# Patient Record
Sex: Female | Born: 1985 | Race: White | Hispanic: No | Marital: Married | Smoking: Never smoker
Health system: Southern US, Community
[De-identification: ages and names within clinical notes are randomized; demographics above are authoritative.]

## PROBLEM LIST (undated history)

## (undated) DIAGNOSIS — E669 Obesity, unspecified: Secondary | ICD-10-CM

## (undated) DIAGNOSIS — K649 Unspecified hemorrhoids: Secondary | ICD-10-CM

## (undated) HISTORY — DX: Obesity, unspecified: E66.9

## (undated) HISTORY — PX: KNEE SURGERY: SHX244

## (undated) HISTORY — DX: Unspecified hemorrhoids: K64.9

## (undated) HISTORY — PX: ARTHROSCOPIC REPAIR ACL: SUR80

---

## 2013-11-26 ENCOUNTER — Ambulatory Visit (INDEPENDENT_AMBULATORY_CARE_PROVIDER_SITE_OTHER): Payer: BC Managed Care – PPO | Admitting: Obstetrics & Gynecology

## 2013-11-26 ENCOUNTER — Encounter: Payer: Self-pay | Admitting: Obstetrics & Gynecology

## 2013-11-26 VITALS — BP 140/90 | HR 91 | Resp 16 | Ht 65.0 in | Wt 228.0 lb

## 2013-11-26 DIAGNOSIS — Z30432 Encounter for removal of intrauterine contraceptive device: Secondary | ICD-10-CM | POA: Diagnosis not present

## 2013-11-26 DIAGNOSIS — Z124 Encounter for screening for malignant neoplasm of cervix: Secondary | ICD-10-CM

## 2013-11-26 DIAGNOSIS — Z01419 Encounter for gynecological examination (general) (routine) without abnormal findings: Secondary | ICD-10-CM

## 2013-11-26 NOTE — Addendum Note (Signed)
Addended by: Granville Lewis on: 11/26/2013 04:21 PM   Modules accepted: Orders

## 2013-11-26 NOTE — Progress Notes (Signed)
  Subjective:     Megan Knox is a 28 y.o. female here for a routine exam.  Current complaints: none.  Wants to become pregnant and wants IUD out.  History of 32 week delivery.  Personal health questionnaire reviewed: yes.   Gynecologic History Patient's last menstrual period was 11/18/2013. Contraception: IUD Last Pap: 2011. Results were: normal  Obstetric History OB History  Gravida Para Term Preterm AB SAB TAB Ectopic Multiple Living  # Outcome Date GA Lbr Len/2nd Weight Sex Delivery Anes PTL Lv  1 PRE  [redacted]w[redacted]d   M SVD          The following portions of the patient's history were reviewed and updated as appropriate: allergies, current medications, past family history, past medical history, past social history, past surgical history and problem list.  Review of Systems Pertinent items are noted in HPI.    Objective:      Filed Vitals:   11/26/13 1511  BP: 140/90  Pulse: 91  Resp: 16  Height:  (1.651 m)  Weight: 228 lb (103.42 kg)   Vitals:  WNL General appearance: alert, cooperative and no distress Head: Normocephalic, without obvious abnormality, atraumatic Eyes: negative Throat: lips, mucosa, and tongue normal; teeth and gums normal Lungs: clear to auscultation bilaterally Breasts: normal appearance, no masses or tenderness, No nipple retraction or dimpling, No nipple discharge or bleeding Heart: regular rate and rhythm Abdomen: soft, non-tender; bowel sounds normal; no masses,  no organomegaly  Pelvic:  External Genitalia:  Tanner V, no lesion Urethra:  No prolapse Vagina:  Pink, normal rugae, no blood or discharge Cervix:  No CMT, no lesion Uterus:  Normal size and contour, non tender Adnexa:  Normal, no masses, non tender  Extremities: no edema, redness or tenderness in the calves or thighs Skin: no lesions or rash Lymph nodes: Axillary adenopathy: none        Assessment:    Healthy female exam.    Plan:    Education reviewed:  self breast exams and skin cancer screening. Contraception: none. Follow up in: 1 year. PNV  GYNECOLOGY CLINIC PROCEDURE NOTE  Megan Knox is a 28 y.o. G1P0101 here for Mirena IUD removal and reinsertion. No GYN concerns.    IUD Removal  Patient identified, informed consent performed. Discussed risks of irregular bleeding, cramping, infection, malpositioning or misplacement of the IUD outside the uterus which may require further procedures. Time out was performed. Speculum placed in the vagina. Cervix visualized. Cleaned with Betadine x 2. Grasped anteriorly with a single tooth tenaculum. The strings of the IUD were grasped and pulled using ring forceps. The IUD was successfully removed in its entirety.

## 2013-12-01 LAB — CYTOLOGY - PAP

## 2014-01-26 ENCOUNTER — Encounter: Payer: Self-pay | Admitting: Obstetrics & Gynecology

## 2014-04-06 ENCOUNTER — Encounter: Payer: Self-pay | Admitting: Obstetrics & Gynecology

## 2014-04-06 ENCOUNTER — Ambulatory Visit (INDEPENDENT_AMBULATORY_CARE_PROVIDER_SITE_OTHER): Payer: 59 | Admitting: Obstetrics & Gynecology

## 2014-04-06 VITALS — BP 132/91 | HR 96 | Resp 16 | Ht 65.0 in | Wt 228.0 lb

## 2014-04-06 DIAGNOSIS — O4690 Antepartum hemorrhage, unspecified, unspecified trimester: Secondary | ICD-10-CM

## 2014-04-06 NOTE — Progress Notes (Signed)
   Subjective:    Patient ID: Leanora Ivanoffrin Dorsi, female    DOB: 11/13/1985, 29 y.o.   MRN: [id number]  HPI   MW 29 yo G2P1 at [redacted] weeks EGA. She was supposed to have a NOB visit today but she started having heavy bleeding last night and thinks that she is having a miscarriage.   Review of Systems     Objective:   Physical Exam  Thickened endometrium on u/s with normal ovaries and no free fluid in the pelvis. No CMT      Assessment & Plan:  Bleeding in early pregnancy- check QBHCG and type and screen Repeat QBHCG in 48 hours.

## 2014-04-07 ENCOUNTER — Telehealth: Payer: Self-pay | Admitting: *Deleted

## 2014-04-07 LAB — ABO AND RH: Rh Type: POSITIVE

## 2014-04-07 LAB — HCG, QUANTITATIVE, PREGNANCY

## 2014-04-07 NOTE — Telephone Encounter (Signed)
LM on voicemail of neg HCG and she does not need to repeat HCG tomorrow.

## 2014-04-26 ENCOUNTER — Encounter: Payer: Self-pay | Admitting: Advanced Practice Midwife

## 2014-05-31 ENCOUNTER — Encounter (HOSPITAL_COMMUNITY): Payer: Self-pay | Admitting: Emergency Medicine

## 2014-05-31 ENCOUNTER — Emergency Department (HOSPITAL_COMMUNITY)
Admission: EM | Admit: 2014-05-31 | Discharge: 2014-06-01 | Disposition: A | Discharge status: Home / Self Care | Payer: 59 | Attending: Emergency Medicine | Admitting: Emergency Medicine

## 2014-05-31 DIAGNOSIS — R1031 Right lower quadrant pain: Secondary | ICD-10-CM | POA: Diagnosis present

## 2014-05-31 DIAGNOSIS — Z3202 Encounter for pregnancy test, result negative: Secondary | ICD-10-CM | POA: Insufficient documentation

## 2014-05-31 DIAGNOSIS — N12 Tubulo-interstitial nephritis, not specified as acute or chronic: Secondary | ICD-10-CM | POA: Diagnosis not present

## 2014-05-31 LAB — CBC WITH DIFFERENTIAL/PLATELET
Basophils Absolute: 0 10*3/uL (ref 0.0–0.1)
Basophils Relative: 0 % (ref 0–1)
EOS ABS: 0.1 10*3/uL (ref 0.0–0.7)
Eosinophils Relative: 1 % (ref 0–5)
HCT: 41.6 % (ref 36.0–46.0)
HEMOGLOBIN: 13.9 g/dL (ref 12.0–15.0)
Lymphocytes Relative: 24 % (ref 12–46)
Lymphs Abs: 2.2 10*3/uL (ref 0.7–4.0)
MCH: 28.8 pg (ref 26.0–34.0)
MCHC: 33.4 g/dL (ref 30.0–36.0)
MCV: 86.3 fL (ref 78.0–100.0)
MONOS PCT: 8 % (ref 3–12)
Monocytes Absolute: 0.8 10*3/uL (ref 0.1–1.0)
Neutro Abs: 6.3 10*3/uL (ref 1.7–7.7)
Neutrophils Relative %: 67 % (ref 43–77)
Platelets: 281 10*3/uL (ref 150–400)
RBC: 4.82 MIL/uL (ref 3.87–5.11)
RDW: 12 % (ref 11.5–15.5)
WBC: 9.5 10*3/uL (ref 4.0–10.5)

## 2014-05-31 LAB — COMPREHENSIVE METABOLIC PANEL
ALT: 14 U/L (ref 0–35)
ANION GAP: 7 (ref 5–15)
AST: 17 U/L (ref 0–37)
Albumin: 4.5 g/dL (ref 3.5–5.2)
Alkaline Phosphatase: 68 U/L (ref 39–117)
BUN: 18 mg/dL (ref 6–23)
CALCIUM: 9.4 mg/dL (ref 8.4–10.5)
CO2: 23 mmol/L (ref 19–32)
CREATININE: 0.85 mg/dL (ref 0.50–1.10)
Chloride: 105 mmol/L (ref 96–112)
GLUCOSE: 101 mg/dL — AB (ref 70–99)
Potassium: 4.1 mmol/L (ref 3.5–5.1)
Sodium: 135 mmol/L (ref 135–145)
TOTAL PROTEIN: 8 g/dL (ref 6.0–8.3)
Total Bilirubin: 0.5 mg/dL (ref 0.3–1.2)

## 2014-05-31 NOTE — ED Notes (Signed)
Pt states she has been having lower right quadrant pain since yesterday  Pt states she went to urgent care and was sent here to rule out appendicitis  Pt has nausea and chills

## 2014-06-01 ENCOUNTER — Encounter (HOSPITAL_COMMUNITY): Payer: Self-pay

## 2014-06-01 ENCOUNTER — Encounter: Payer: Self-pay | Admitting: Obstetrics & Gynecology

## 2014-06-01 ENCOUNTER — Emergency Department (HOSPITAL_COMMUNITY): Payer: 59

## 2014-06-01 LAB — URINALYSIS, ROUTINE W REFLEX MICROSCOPIC
BILIRUBIN URINE: NEGATIVE
Glucose, UA: NEGATIVE mg/dL
Hgb urine dipstick: NEGATIVE
Ketones, ur: NEGATIVE mg/dL
NITRITE: NEGATIVE
PH: 5.5 (ref 5.0–8.0)
Protein, ur: NEGATIVE mg/dL
Specific Gravity, Urine: 1.012 (ref 1.005–1.030)
Urobilinogen, UA: 0.2 mg/dL (ref 0.0–1.0)

## 2014-06-01 LAB — URINE MICROSCOPIC-ADD ON

## 2014-06-01 LAB — PREGNANCY, URINE: PREG TEST UR: NEGATIVE

## 2014-06-01 MED ORDER — HYDROCODONE-ACETAMINOPHEN 5-325 MG PO TABS: 1.0000 | ORAL_TABLET | ORAL | Status: DC | PRN

## 2014-06-01 MED ORDER — IOHEXOL 300 MG/ML  SOLN
50.0000 mL | Freq: Once | INTRAMUSCULAR | Status: AC | PRN
  Administered 2014-06-01: 50 mL via ORAL

## 2014-06-01 MED ORDER — CIPROFLOXACIN HCL 500 MG PO TABS: 500.0000 mg | ORAL_TABLET | Freq: Two times a day (BID) | ORAL | Status: DC

## 2014-06-01 MED ORDER — MORPHINE SULFATE 4 MG/ML IJ SOLN
4.0000 mg | Freq: Once | INTRAMUSCULAR | Status: AC
  Administered 2014-06-01: 4 mg via INTRAVENOUS
  Filled 2014-06-01: qty 1

## 2014-06-01 MED ORDER — IOHEXOL 300 MG/ML  SOLN
100.0000 mL | Freq: Once | INTRAMUSCULAR | Status: AC | PRN
  Administered 2014-06-01: 100 mL via INTRAVENOUS

## 2014-06-01 MED ORDER — ONDANSETRON HCL 4 MG/2ML IJ SOLN
4.0000 mg | Freq: Once | INTRAMUSCULAR | Status: AC
  Administered 2014-06-01: 4 mg via INTRAVENOUS
  Filled 2014-06-01: qty 2

## 2014-06-01 MED ORDER — CIPROFLOXACIN HCL 500 MG PO TABS
500.0000 mg | ORAL_TABLET | Freq: Once | ORAL | Status: AC
  Administered 2014-06-01: 500 mg via ORAL
  Filled 2014-06-01: qty 1

## 2014-06-01 MED ORDER — NAPROXEN 500 MG PO TABS: 500.0000 mg | ORAL_TABLET | Freq: Two times a day (BID) | ORAL | Status: DC

## 2014-06-01 MED ORDER — ONDANSETRON 8 MG PO TBDP: 8.0000 mg | ORAL_TABLET | Freq: Three times a day (TID) | ORAL | Status: DC | PRN

## 2014-06-01 NOTE — Discharge Instructions (Signed)
Your CT scan today did not show any signs of appendicitis.  He did have inflammation around your right kidney which is concerning for an early infection.  Take antibiotics as prescribed.  Take pain and nausea medicine as needed.  Drink plenty of fluids.  Follow-up with your Dr. for recheck in 5-7 days.  Return to the emergency department for fevers, persistent pain, vomiting or other reasons why you're unable take your antibiotics.    Pyelonephritis, Adult Pyelonephritis is a kidney infection. In general, there are 2 main types of pyelonephritis:  Infections that come on quickly without any warning (acute pyelonephritis).  Infections that persist for a long period of time (chronic pyelonephritis). CAUSES  Two main causes of pyelonephritis are:  Bacteria traveling from the bladder to the kidney. This is a problem especially in pregnant women. The urine in the bladder can become filled with bacteria from multiple causes, including:  Inflammation of the prostate gland (prostatitis).  Sexual intercourse in females.  Bladder infection (cystitis).  Bacteria traveling from the bloodstream to the tissue part of the kidney. Problems that may increase your risk of getting a kidney infection include:  Diabetes.  Kidney stones or bladder stones.  Cancer.  Catheters placed in the bladder.  Other abnormalities of the kidney or ureter. SYMPTOMS   Abdominal pain.  Pain in the side or flank area.  Fever.  Chills.  Upset stomach.  Blood in the urine (dark urine).  Frequent urination.  Strong or persistent urge to urinate.  Burning or stinging when urinating. DIAGNOSIS  Your caregiver may diagnose your kidney infection based on your symptoms. A urine sample may also be taken. TREATMENT  In general, treatment depends on how severe the infection is.   If the infection is mild and caught early, your caregiver may treat you with oral antibiotics and send you home.  If the  infection is more severe, the bacteria may have gotten into the bloodstream. This will require intravenous (IV) antibiotics and a hospital stay. Symptoms may include:  High fever.  Severe flank pain.  Shaking chills.  Even after a hospital stay, your caregiver may require you to be on oral antibiotics for a period of time.  Other treatments may be required depending upon the cause of the infection. HOME CARE INSTRUCTIONS   Take your antibiotics as directed. Finish them even if you start to feel better.  Make an appointment to have your urine checked to make sure the infection is gone.  Drink enough fluids to keep your urine clear or pale yellow.  Take medicines for the bladder if you have urgency and frequency of urination as directed by your caregiver. SEEK IMMEDIATE MEDICAL CARE IF:   You have a fever or persistent symptoms for more than 2-3 days.  You have a fever and your symptoms suddenly get worse.  You are unable to take your antibiotics or fluids.  You develop shaking chills.  You experience extreme weakness or fainting.  There is no improvement after 2 days of treatment. MAKE SURE YOU:  Understand these instructions.  Will watch your condition.  Will get help right away if you are not doing well or get worse. Document Released: 03/12/2005 Document Revised: 09/11/2011 Document Reviewed: 08/16/2010 [facility] Patient Information 2015 [place], [place]. This information is not intended to replace advice given to you by your health care provider. Make sure you discuss any questions you have with your health care provider.

## 2014-06-01 NOTE — ED Provider Notes (Signed)
CSN: [id number]     Arrival date & time 05/31/14  2008 History   First MD Initiated Contact with Patient 05/31/14 2306     Chief Complaint  Patient presents with  . Abdominal Pain     (Consider location/radiation/quality/duration/timing/severity/associated sxs/prior Treatment) HPI 29 year old female presents to emergency part with complaint of right lower quadrant pain ongoing for the last 2 days.  There is some radiation into her back.  She was seen earlier today at urgent care and had negative pregnancy test and urinalysis.  It was recommended that she come to the emergency department for further evaluation.  She denies any fever but has had chills.  She has had nausea but no vomiting.  She reports normal bowel movements.  She denies any vaginal discharge.  Patient had miscarriage in January.  Patient reports pain is worse with movement and palpation.  No prior history of same. History reviewed. No pertinent past medical history. Past Surgical History  Procedure Laterality Date  . Knee surgery     Family History  Problem Relation Age of Onset  . Cancer Father    History  Substance Use Topics  . Smoking status: Never Smoker   . Smokeless tobacco: Not on file  . Alcohol Use: No   OB History    No data available     Review of Systems   See History of Present Illness; otherwise all other systems are reviewed and negative  Allergies  Sulfa antibiotics  Home Medications   Prior to Admission medications   Not on File   BP 139/83 mmHg  Pulse 79  Temp(Src) 98 F (36.7 C) (Oral)  Resp 20  Ht 5\' 5"  (1.651 m)  Wt 225 lb (102.059 kg)  BMI 37.44 kg/m2  SpO2 100%  LMP 05/13/2014 (Exact Date) Physical Exam  Constitutional: She is oriented to person, place, and time. She appears well-developed and well-nourished.  HENT:  Head: Normocephalic and atraumatic.  Nose: Nose normal.  Mouth/Throat: Oropharynx is clear and moist.  Eyes: Conjunctivae and EOM are normal. Pupils are  equal, round, and reactive to light.  Neck: Normal range of motion. Neck supple. No JVD present. No tracheal deviation present. No thyromegaly present.  Cardiovascular: Normal rate, regular rhythm, normal heart sounds and intact distal pulses.  Exam reveals no gallop and no friction rub.   No murmur heard. Pulmonary/Chest: Effort normal and breath sounds normal. No stridor. No respiratory distress. She has no wheezes. She has no rales. She exhibits no tenderness.  Abdominal: Soft. Bowel sounds are normal. She exhibits no distension and no mass. There is tenderness. There is rebound. There is no guarding.  She has tenderness over McBurney's point with some rebound, no guarding  Musculoskeletal: Normal range of motion. She exhibits no edema or tenderness.  Lymphadenopathy:    She has no cervical adenopathy.  Neurological: She is alert and oriented to person, place, and time. She displays normal reflexes. She exhibits normal muscle tone. Coordination normal.  Skin: Skin is warm and dry. No rash noted. No erythema. No pallor.  Psychiatric: She has a normal mood and affect. Her behavior is normal. Judgment and thought content normal.  Nursing note and vitals reviewed.   ED Course  Procedures (including critical care time) Labs Review Labs Reviewed  COMPREHENSIVE METABOLIC PANEL - Abnormal; Notable for the following:    Glucose, Bld 101 (*)    All other components within normal limits  URINALYSIS, ROUTINE W REFLEX MICROSCOPIC - Abnormal; Notable for the following:  APPearance CLOUDY (*)    Leukocytes, UA TRACE (*)    All other components within normal limits  URINE MICROSCOPIC-ADD ON - Abnormal; Notable for the following:    Bacteria, UA FEW (*)    All other components within normal limits  URINE CULTURE  CBC WITH DIFFERENTIAL/PLATELET  PREGNANCY, URINE    Imaging Review Ct Abdomen Pelvis W Contrast  06/01/2014   CLINICAL DATA:  RIGHT lower quadrant pain, nausea, chills for 2 days.  Assess for appendicitis.  EXAM: CT ABDOMEN AND PELVIS WITH CONTRAST  TECHNIQUE: Multidetector CT imaging of the abdomen and pelvis was performed using the standard protocol following bolus administration of intravenous contrast.  CONTRAST:  OMNIPAQUE IOHEXOL 300 MG/ML  SOLN  COMPARISON:  None.  FINDINGS: LUNG BASES: Included view of the lung bases are clear. Visualized heart and pericardium are unremarkable.  SOLID ORGANS: The liver, spleen, gallbladder, pancreas and adrenal glands are unremarkable.  GASTROINTESTINAL TRACT: The stomach, small and large bowel are normal in course and caliber without inflammatory changes. Normal appendix.  KIDNEYS/ URINARY TRACT: Kidneys are orthotopic, slightly striated enhancement of the RIGHT kidney with minimally delayed nephrogram. No nephrolithiasis, hydronephrosis or solid renal masses. Too small to characterize hypodensity upper pole of LEFT kidney. The unopacified ureters are normal in course and caliber. Urinary bladder is partially distended and unremarkable.  PERITONEUM/RETROPERITONEUM: Aortoiliac vessels are normal in course and caliber. No lymphadenopathy by CT size criteria. Internal reproductive organs are unremarkable. No intraperitoneal free fluid nor free air.  SOFT TISSUE/OSSEOUS STRUCTURES: Non-suspicious. Sub cm probable bone island LEFT iliac wing. Scattered Schmorl's nodes. Small fat containing umbilical hernia.  IMPRESSION: Mildly striated RIGHT nephrogram concerning for early pyelonephritis without obstructive uropathy or nephrolithiasis.  Normal appendix.   Electronically Signed   By: Awilda Metro   On: 06/01/2014 02:12     EKG Interpretation None      MDM   Final diagnoses:  Pyelonephritis    29 year old female with 2 days of right lower quadrant pain.  Labs unremarkable.  Exam concerning for possible appendicitis, plan for CT abdomen pelvis.  CT scan without appendicitis, early pyelonephritis suspected.  Will start on  antibiotics.  Patient updated on findings and plan.    Marisa Severin, MD 06/01/14 [phone]

## 2014-06-03 LAB — URINE CULTURE: Colony Count: >=100000

## 2014-06-05 ENCOUNTER — Telehealth (HOSPITAL_BASED_OUTPATIENT_CLINIC_OR_DEPARTMENT_OTHER): Payer: Self-pay | Admitting: Emergency Medicine

## 2014-06-05 NOTE — Telephone Encounter (Signed)
Post ED Visit - Positive Culture Follow-up  Culture report reviewed by antimicrobial stewardship pharmacist: []  Wes Dulaney, Pharm.D., BCPS [x]  Celedonio MiyamotoJeremy Frens, Pharm.D., BCPS []  Georgina PillionElizabeth Martin, Pharm.D., BCPS []  [place]esvilleMinh Pham, [address]Pharm[address]D., BCPS, AAHIVP []  Estella HuskMichelle Turner, Pharm.D., BCPS, AAHIVP []  Elder CyphersLorie Poole, [address]Pharm.D., BCPS  Positive Urine culture Treated with Cipro, organism sensitive to the same and no further patient follow-up is required at this time.  Jiles HaroldGammons, [place] Bahri Chaney 06/05/2014, 4:19 PM

## 2014-07-12 ENCOUNTER — Emergency Department (INDEPENDENT_AMBULATORY_CARE_PROVIDER_SITE_OTHER)
Admission: EM | Admit: 2014-07-12 | Discharge: 2014-07-12 | Disposition: A | Discharge status: Home / Self Care | Payer: 59 | Source: Home / Self Care

## 2014-07-12 ENCOUNTER — Encounter: Payer: Self-pay | Admitting: *Deleted

## 2014-07-12 DIAGNOSIS — Z23 Encounter for immunization: Secondary | ICD-10-CM

## 2014-07-12 MED ORDER — TETANUS-DIPHTH-ACELL PERTUSSIS 5-2.5-18.5 LF-MCG/0.5 IM SUSP
0.5000 mL | Freq: Once | INTRAMUSCULAR | Status: AC
  Administered 2014-07-12: 0.5 mL via INTRAMUSCULAR

## 2014-07-12 NOTE — ED Notes (Signed)
Pt here for TDaP only. Given in left deltiod

## 2014-10-21 ENCOUNTER — Other Ambulatory Visit: Payer: Self-pay | Admitting: Obstetrics & Gynecology

## 2014-10-21 ENCOUNTER — Encounter: Payer: Self-pay | Admitting: Obstetrics & Gynecology

## 2014-10-21 ENCOUNTER — Ambulatory Visit (INDEPENDENT_AMBULATORY_CARE_PROVIDER_SITE_OTHER): Payer: 59 | Admitting: Obstetrics & Gynecology

## 2014-10-21 VITALS — BP 145/76 | HR 91 | Wt 234.0 lb

## 2014-10-21 DIAGNOSIS — Z8751 Personal history of pre-term labor: Secondary | ICD-10-CM

## 2014-10-21 DIAGNOSIS — O09291 Supervision of pregnancy with other poor reproductive or obstetric history, first trimester: Secondary | ICD-10-CM | POA: Diagnosis not present

## 2014-10-21 DIAGNOSIS — O169 Unspecified maternal hypertension, unspecified trimester: Secondary | ICD-10-CM | POA: Insufficient documentation

## 2014-10-21 DIAGNOSIS — O0991 Supervision of high risk pregnancy, unspecified, first trimester: Secondary | ICD-10-CM

## 2014-10-21 DIAGNOSIS — O161 Unspecified maternal hypertension, first trimester: Secondary | ICD-10-CM

## 2014-10-21 NOTE — Progress Notes (Signed)
Last pap 2015=neg   Bedside U/S shows IUP with FHT of 143 BPM and CRL of10.73mm GA [redacted]w[redacted]d.  Pt does state that she has long 34 day cycles.

## 2014-10-21 NOTE — Progress Notes (Signed)
   Subjective:    Megan Knox is a G3P0100 [redacted]w[redacted]d being seen today for her first obstetrical visit.  Her obstetrical history is significant for preterm delivery at 31 weeks and preeclampsia. Patient does intend to breast feed. Pregnancy history fully reviewed.  Patient reports fatigue and nausea.  Filed Vitals:   10/21/14 1006  BP: 145/76  Pulse: 91  Weight: 234 lb (106.142 kg)    HISTORY: OB History  Gravida Para Term Preterm AB SAB TAB Ectopic Multiple Living  3 1 0 1 0 0 0 0      # Outcome Date GA Lbr Len/2nd Weight Sex Delivery Anes PTL Lv  3 Current           2 Gravida           1 Preterm  [redacted]w[redacted]d   M Vag-Spont        Past Medical History  Diagnosis Date  . Obesity (BMI 30-39.9)   . Hemorrhoid    Past Surgical History  Procedure Laterality Date  . Arthroscopic repair acl Bilateral   . Knee surgery     Family History  Problem Relation Age of Onset  . Cancer - Lung Father   . Thyroid disease Mother   . Thyroid disease Sister   . Thyroid disease Maternal Grandmother   . Cancer Father      Exam    Uterus:     Pelvic Exam:    Perineum: Hemorrhoid present   Vulva: normal   Vagina:  normal mucosa   pH: n/a   Cervix: no lesions   Adnexa: normal adnexa   Bony Pelvis: average  System: Breast:  normal appearance, no masses or tenderness   Skin: normal coloration and turgor, no rashes    Neurologic: oriented, normal   Extremities: no deformities   HEENT sclera clear, anicteric, oropharynx clear, no lesions, neck supple with midline trachea, thyroid without masses and trachea midline   Mouth/Teeth mucous membranes moist, pharynx normal without lesions and dental hygiene good   Neck supple and no masses   Cardiovascular: regular rate and rhythm   Respiratory:  appears well, vitals normal, no respiratory distress, acyanotic, normal RR, chest clear, no wheezing, crepitations, rhonchi, normal symmetric air entry   Abdomen: soft, non-tender; bowel sounds normal; no  masses,  no organomegaly   Urinary: urethral meatus normal      Assessment:    Pregnancy: G3P0100 Patient Active Problem List   Diagnosis Date Noted  . Supervision of high risk pregnancy in first trimester 10/21/2014  . History of preterm delivery 10/21/2014        Plan:     Initial labs drawn. Prenatal vitamins. Problem list reviewed and updated. Genetic Screening discussed First Screen: ordered.  Ultrasound discussed; fetal survey: requested.  Follow up in 4 weeks.  History of preterm delivery (spontaneous)--17P weekly 16-36 weeks  History of preeclampsia at 76 weeks--baby asa 12 weeks  BP elevated today.  Check pr/cr ratio.  RTC in 1 week for BP check.  If still elevated, will get 24 hour urine and CMP.   [id number],Nobuko Gsell H. 10/21/2014

## 2014-10-22 LAB — OBSTETRIC PANEL
Antibody Screen: NEGATIVE
Basophils Absolute: 0 10*3/uL (ref 0.0–0.1)
Basophils Relative: 0 % (ref 0–1)
EOS ABS: 0.1 10*3/uL (ref 0.0–0.7)
Eosinophils Relative: 1 % (ref 0–5)
HCT: 38.6 % (ref 36.0–46.0)
Hemoglobin: 12.9 g/dL (ref 12.0–15.0)
Hepatitis B Surface Ag: NEGATIVE
Lymphocytes Relative: 21 % (ref 12–46)
Lymphs Abs: 1.9 10*3/uL (ref 0.7–4.0)
MCH: 28.2 pg (ref 26.0–34.0)
MCHC: 33.4 g/dL (ref 30.0–36.0)
MCV: 84.5 fL (ref 78.0–100.0)
MPV: 9.3 fL (ref 8.6–12.4)
Monocytes Absolute: 0.5 10*3/uL (ref 0.1–1.0)
Monocytes Relative: 6 % (ref 3–12)
Neutro Abs: 6.4 10*3/uL (ref 1.7–7.7)
Neutrophils Relative %: 72 % (ref 43–77)
Platelets: 288 10*3/uL (ref 150–400)
RBC: 4.57 MIL/uL (ref 3.87–5.11)
RDW: 13.5 % (ref 11.5–15.5)
RUBELLA: 1.13 {index} — AB (ref <0.90)
Rh Type: POSITIVE
WBC: 8.9 10*3/uL (ref 4.0–10.5)

## 2014-10-22 LAB — GC/CHLAMYDIA PROBE AMP
CT Probe RNA: NEGATIVE
GC Probe RNA: NEGATIVE

## 2014-10-22 LAB — HIV ANTIBODY (ROUTINE TESTING W REFLEX): HIV 1&2 Ab, 4th Generation: NONREACTIVE

## 2014-10-23 LAB — CULTURE, URINE COMPREHENSIVE
Colony Count: NO GROWTH
Organism ID, Bacteria: NO GROWTH

## 2014-10-26 LAB — CYSTIC FIBROSIS DIAGNOSTIC STUDY

## 2014-11-22 ENCOUNTER — Encounter (INDEPENDENT_AMBULATORY_CARE_PROVIDER_SITE_OTHER): Payer: Self-pay

## 2014-11-22 ENCOUNTER — Ambulatory Visit (INDEPENDENT_AMBULATORY_CARE_PROVIDER_SITE_OTHER): Payer: 59 | Admitting: Obstetrics & Gynecology

## 2014-11-22 VITALS — BP 147/86 | HR 115 | Wt 239.0 lb

## 2014-11-22 DIAGNOSIS — Z23 Encounter for immunization: Secondary | ICD-10-CM

## 2014-11-22 DIAGNOSIS — O0991 Supervision of high risk pregnancy, unspecified, first trimester: Secondary | ICD-10-CM | POA: Diagnosis not present

## 2014-11-22 DIAGNOSIS — Z8751 Personal history of pre-term labor: Secondary | ICD-10-CM | POA: Diagnosis not present

## 2014-11-22 MED ORDER — ASPIRIN 81 MG PO CHEW: 81.0000 mg | CHEWABLE_TABLET | Freq: Every day | ORAL | Status: AC

## 2014-11-22 NOTE — Progress Notes (Signed)
Subjective:  Megan Knox is a 29 y.o. G3P0100 at [redacted]w[redacted]d being seen today for ongoing prenatal care.  Patient reports no complaints.  Contractions: Not present.  Vag. Bleeding: None. Movement: Absent. Denies leaking of fluid.   The following portions of the patient's history were reviewed and updated as appropriate: allergies, current medications, past family history, past medical history, past social history, past surgical history and problem list.   Objective:   Filed Vitals:   11/22/14 1348  BP: 147/86  Pulse: 115  Weight: 239 lb (108.41 kg)    Fetal Status: Fetal Heart Rate (bpm): 155   Movement: Absent     General:  Alert, oriented and cooperative. Patient is in no acute distress.  Skin: Skin is warm and dry. No rash noted.   Cardiovascular: Normal heart rate noted  Respiratory: Normal respiratory effort, no problems with respiration noted  Abdomen: Soft, gravid, appropriate for gestational age. Pain/Pressure: Absent     Pelvic: Vag. Bleeding: None Vag D/C Character: Thin   Cervical exam deferred        Extremities: Normal range of motion.  Edema: None  Mental Status: Normal mood and affect. Normal behavior. Normal judgment and thought content.   Urinalysis: Urine Protein: Negative Urine Glucose: Negative  Assessment and Plan:  Pregnancy: G3P0100 at [redacted]w[redacted]d  1. Supervision of high risk pregnancy in first trimester Elevated BPs c/w CHTN.  Will get 24 hour urine and labs First screen Flu shot Need reocrds from [facility] whether delivery was only for preeclampsia or from PTL (presented with vaginal bleeding and pressures not elevated)  Preterm labor symptoms and general obstetric precautions including but not limited to vaginal bleeding, contractions, leaking of fluid and fetal movement were reviewed in detail with the patient. Please refer to After Visit Summary for other counseling recommendations.  Return in about 4 weeks (around 12/20/2014).   Lesly Dukes, MD

## 2014-11-30 ENCOUNTER — Ambulatory Visit (HOSPITAL_COMMUNITY)
Admission: RE | Admit: 2014-11-30 | Discharge: 2014-11-30 | Disposition: A | Discharge status: Home / Self Care | Payer: 59 | Source: Ambulatory Visit | Attending: Obstetrics & Gynecology | Admitting: Obstetrics & Gynecology

## 2014-11-30 ENCOUNTER — Encounter (HOSPITAL_COMMUNITY): Payer: Self-pay

## 2014-11-30 ENCOUNTER — Other Ambulatory Visit: Payer: Self-pay | Admitting: Obstetrics & Gynecology

## 2014-11-30 DIAGNOSIS — O09211 Supervision of pregnancy with history of pre-term labor, first trimester: Secondary | ICD-10-CM | POA: Insufficient documentation

## 2014-11-30 DIAGNOSIS — O09291 Supervision of pregnancy with other poor reproductive or obstetric history, first trimester: Secondary | ICD-10-CM

## 2014-11-30 DIAGNOSIS — Z8751 Personal history of pre-term labor: Secondary | ICD-10-CM

## 2014-11-30 DIAGNOSIS — O0991 Supervision of high risk pregnancy, unspecified, first trimester: Secondary | ICD-10-CM

## 2014-11-30 DIAGNOSIS — O10019 Pre-existing essential hypertension complicating pregnancy, unspecified trimester: Secondary | ICD-10-CM | POA: Diagnosis not present

## 2014-11-30 DIAGNOSIS — Z369 Encounter for antenatal screening, unspecified: Secondary | ICD-10-CM

## 2014-11-30 DIAGNOSIS — O161 Unspecified maternal hypertension, first trimester: Secondary | ICD-10-CM

## 2014-11-30 DIAGNOSIS — Z3A12 12 weeks gestation of pregnancy: Secondary | ICD-10-CM

## 2014-11-30 DIAGNOSIS — Z36 Encounter for antenatal screening of mother: Secondary | ICD-10-CM | POA: Insufficient documentation

## 2014-12-01 LAB — CREATININE CLEARANCE, URINE, 24 HOUR
CREATININE 24H UR: 2058 mg/d — AB (ref 700–1800)
Creatinine Clearance: 246 mL/min — ABNORMAL HIGH (ref 75–115)
Creatinine, Urine: 82.3 mg/dL
Creatinine: 0.58 mg/dL (ref 0.50–1.10)

## 2014-12-01 LAB — COMPREHENSIVE METABOLIC PANEL
ALBUMIN: 3.8 g/dL (ref 3.6–5.1)
ALK PHOS: 45 U/L (ref 33–115)
ALT: 12 U/L (ref 6–29)
AST: 15 U/L (ref 10–30)
BILIRUBIN TOTAL: 0.3 mg/dL (ref 0.2–1.2)
BUN: 10 mg/dL (ref 7–25)
CHLORIDE: 104 mmol/L (ref 98–110)
CO2: 20 mmol/L (ref 20–31)
CREATININE: 0.58 mg/dL (ref 0.50–1.10)
Calcium: 9.2 mg/dL (ref 8.6–10.2)
Glucose, Bld: 127 mg/dL — ABNORMAL HIGH (ref 65–99)
Potassium: 3.6 mmol/L (ref 3.5–5.3)
SODIUM: 136 mmol/L (ref 135–146)
TOTAL PROTEIN: 6.8 g/dL (ref 6.1–8.1)

## 2014-12-01 LAB — PROTEIN, URINE, 24 HOUR
Protein, 24H Urine: 150 mg/d — ABNORMAL HIGH (ref <150)
Protein, Urine: 6 mg/dL (ref 5–24)

## 2014-12-06 ENCOUNTER — Ambulatory Visit: Payer: 59 | Admitting: Obstetrics & Gynecology

## 2014-12-07 ENCOUNTER — Other Ambulatory Visit (HOSPITAL_COMMUNITY): Payer: Self-pay | Admitting: Obstetrics & Gynecology

## 2014-12-08 ENCOUNTER — Telehealth: Payer: Self-pay

## 2014-12-08 NOTE — Telephone Encounter (Signed)
error 

## 2014-12-15 ENCOUNTER — Encounter: Payer: Self-pay | Admitting: *Deleted

## 2014-12-16 ENCOUNTER — Encounter: Payer: Self-pay | Admitting: Obstetrics & Gynecology

## 2014-12-16 ENCOUNTER — Ambulatory Visit (INDEPENDENT_AMBULATORY_CARE_PROVIDER_SITE_OTHER): Payer: 59 | Admitting: Obstetrics & Gynecology

## 2014-12-16 VITALS — BP 135/86 | HR 104 | Wt 236.0 lb

## 2014-12-16 DIAGNOSIS — E669 Obesity, unspecified: Secondary | ICD-10-CM | POA: Diagnosis not present

## 2014-12-16 DIAGNOSIS — Z3A15 15 weeks gestation of pregnancy: Secondary | ICD-10-CM

## 2014-12-16 DIAGNOSIS — O0991 Supervision of high risk pregnancy, unspecified, first trimester: Secondary | ICD-10-CM

## 2014-12-16 DIAGNOSIS — O99212 Obesity complicating pregnancy, second trimester: Secondary | ICD-10-CM | POA: Insufficient documentation

## 2014-12-16 DIAGNOSIS — O162 Unspecified maternal hypertension, second trimester: Secondary | ICD-10-CM

## 2014-12-16 NOTE — Progress Notes (Signed)
Subjective:  Megan Knox is a 29 y.o. G3P0101 at [redacted]w[redacted]d being seen today for ongoing prenatal care.  Patient reports no complaints.   .  Vag. Bleeding: None. Movement: Absent. Denies leaking of fluid.   The following portions of the patient's history were reviewed and updated as appropriate: allergies, current medications, past family history, past medical history, past social history, past surgical history and problem list.   Objective:   Filed Vitals:   12/16/14 1434  BP: 135/86  Pulse: 104  Weight: 236 lb (107.049 kg)    Fetal Status: Fetal Heart Rate (bpm): 145   Movement: Absent     General:  Alert, oriented and cooperative. Patient is in no acute distress.  Skin: Skin is warm and dry. No rash noted.   Cardiovascular: Normal heart rate noted  Respiratory: Normal respiratory effort, no problems with respiration noted  Abdomen: Soft, gravid, appropriate for gestational age. Pain/Pressure: Absent     Pelvic: Vag. Bleeding: None Vag D/C Character: Thin   Cervical exam deferred        Extremities: Normal range of motion.  Edema: None  Mental Status: Normal mood and affect. Normal behavior. Normal judgment and thought content.   Urinalysis: Urine Protein: Negative Urine Glucose: Negative  Assessment and Plan:  Pregnancy: G3P0101 at [redacted]w[redacted]d  1. Supervision of high risk pregnancy in first trimester   2. Hypertension in pregnancy, second trimester   3. Obesity affecting pregnancy in second trimester, antepartum I have recommended an early glucola. She is moving to [place].  I have encouraged her to call asap and schedule prenatal care there.  Preterm labor symptoms and general obstetric precautions including but not limited to vaginal bleeding, contractions, leaking of fluid and fetal movement were reviewed in detail with the patient. Please refer to After Visit Summary for other counseling recommendations.  Return in about 4 weeks (around 01/13/2015), or if symptoms worsen or  fail to improve, for Quad screen.   Allie Bossier, MD

## 2014-12-16 NOTE — Progress Notes (Signed)
Moving to [place] on friday

## 2014-12-20 ENCOUNTER — Encounter: Payer: 59 | Admitting: Obstetrics & Gynecology

## 2015-08-26 ENCOUNTER — Encounter (HOSPITAL_COMMUNITY): Payer: Self-pay | Admitting: *Deleted

## 2016-10-29 IMAGING — CT CT ABD-PELV W/ CM
1 of 2 series · 15 of 32 positions shown, 19 images · IV contrast (omnipaque)
Comparison: None.

CLINICAL DATA: RIGHT lower quadrant pain, nausea, chills for 2
days. Assess for appendicitis.

EXAM:
CT ABDOMEN AND PELVIS WITH CONTRAST
TECHNIQUE: Multidetector CT imaging of the abdomen and pelvis was performed
using the standard protocol following bolus administration of
intravenous contrast.
CONTRAST:  100mL OMNIPAQUE IOHEXOL 300 MG/ML  SOLN

[Series 2: abd/pel with · axial · 0.69mm/px · z∈[-328,+92]mm · 15 of 94 slices shown, 19 images]
[im 5/94  soft-tissue]
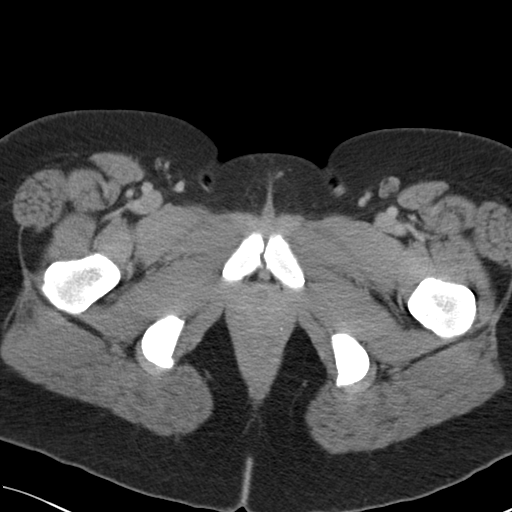
[im 5/94  bone]
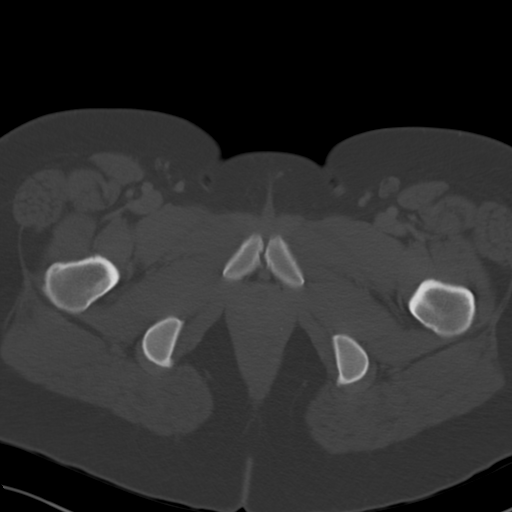
[im 13/94  soft-tissue]
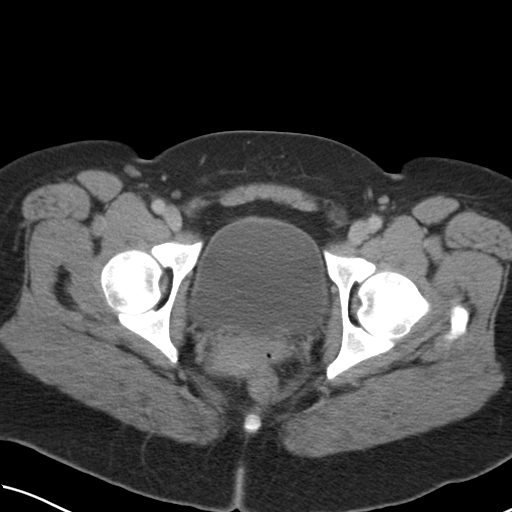
[im 21/94  soft-tissue]
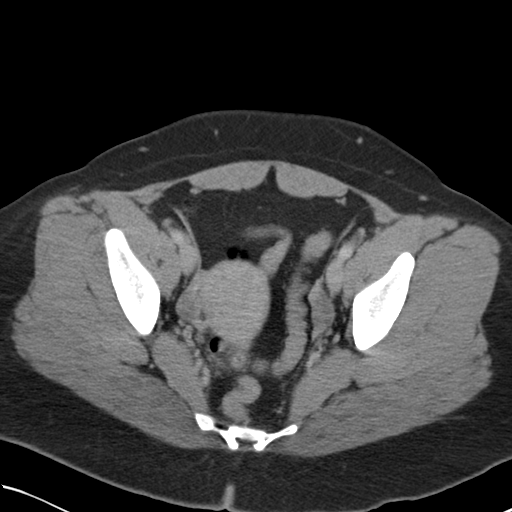
[im 25/94  soft-tissue]
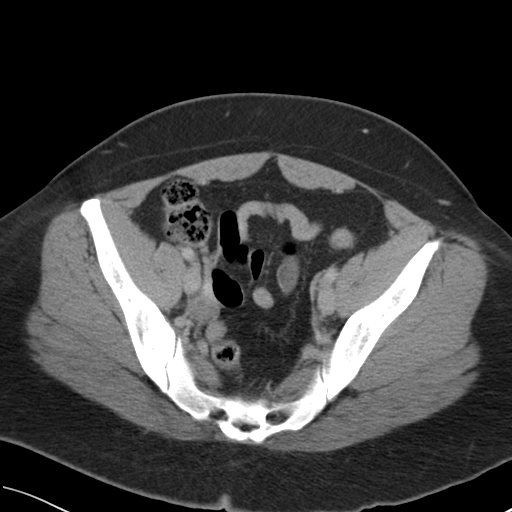
[im 33/94  soft-tissue]
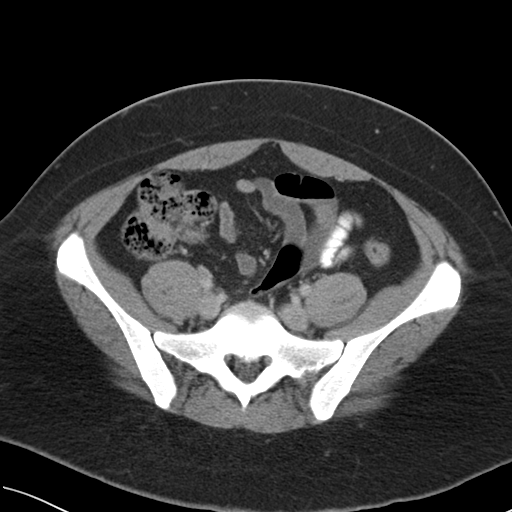
[im 41/94  soft-tissue]
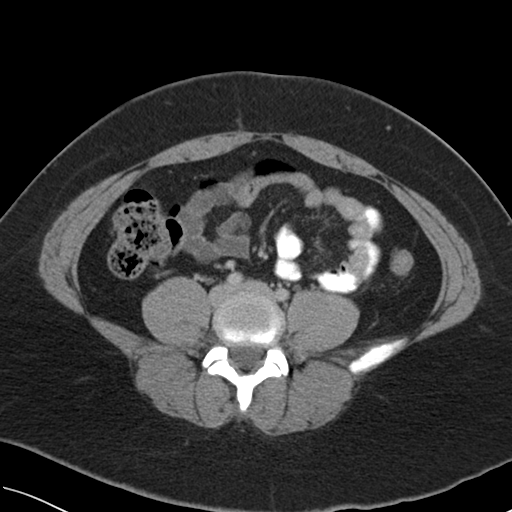
[im 49/94  soft-tissue]
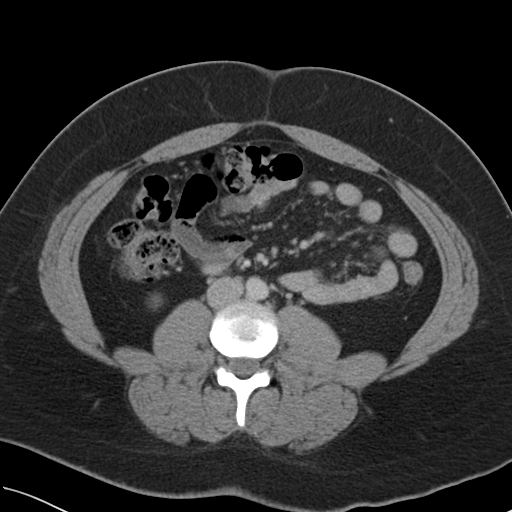
[im 53/94  soft-tissue]
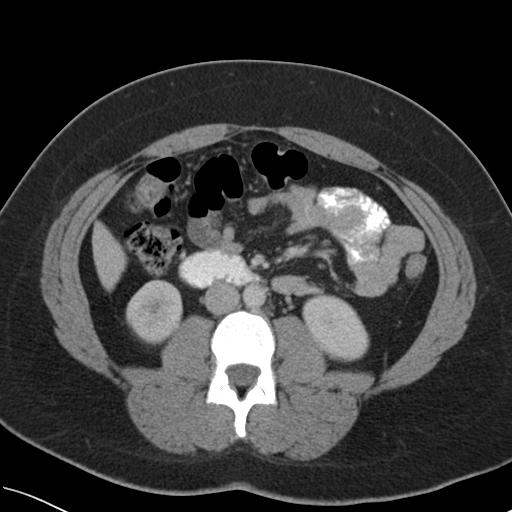
[im 61/94  soft-tissue]
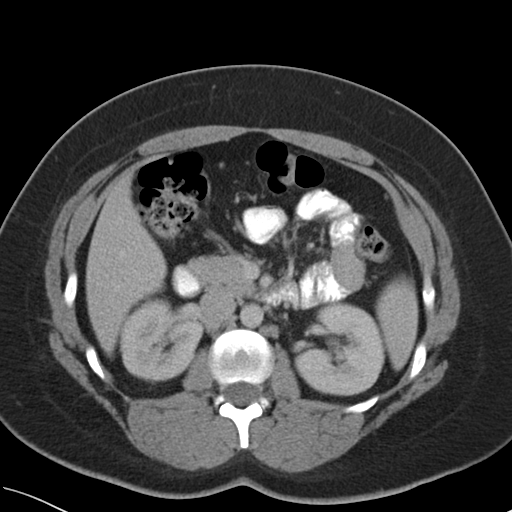
[im 61/94  bone]
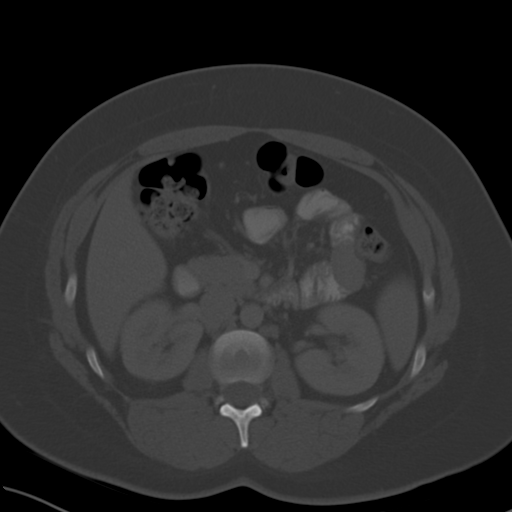
[im 69/94  soft-tissue]
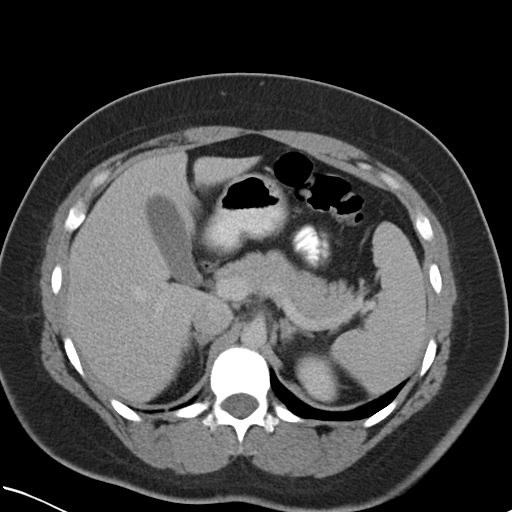
[im 73/94  soft-tissue]
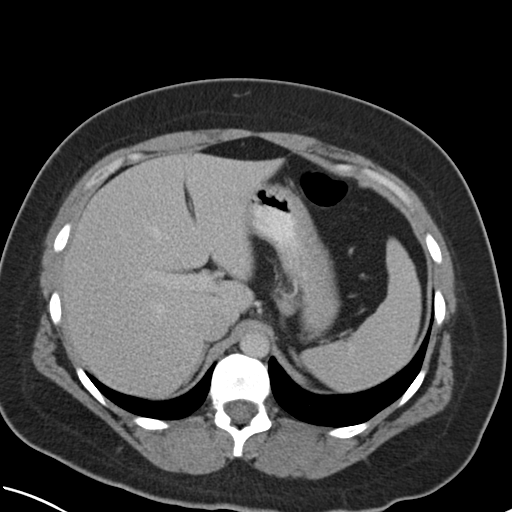
[im 77/94  lung]
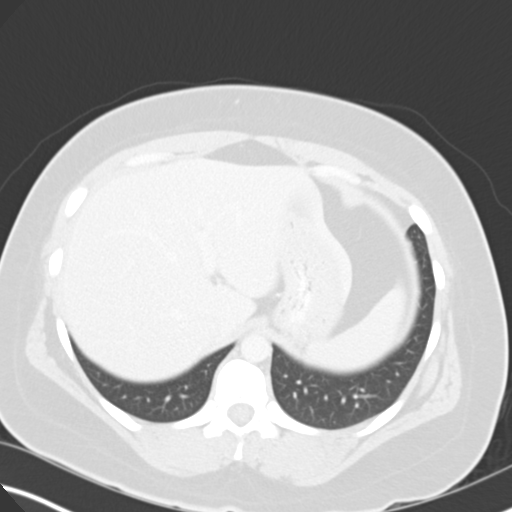
[im 81/94  soft-tissue]
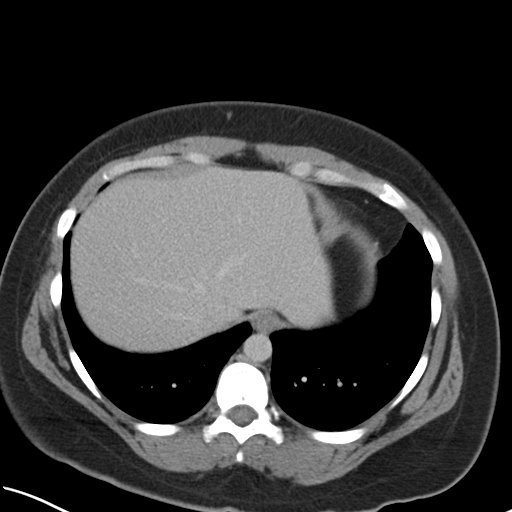
[im 81/94  lung]
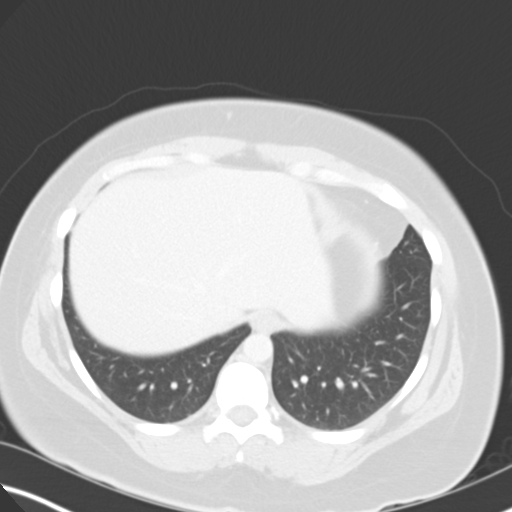
[im 85/94  lung]
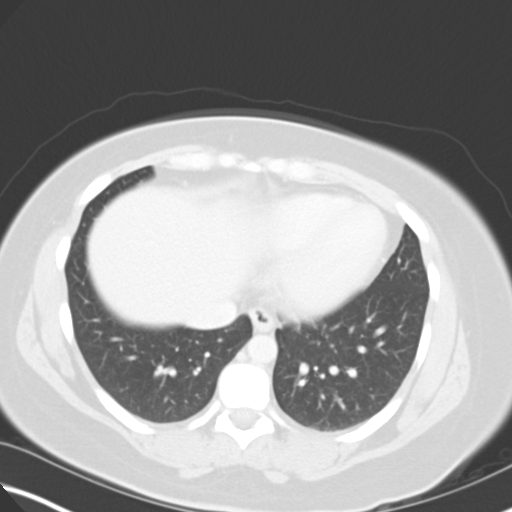
[im 89/94  soft-tissue]
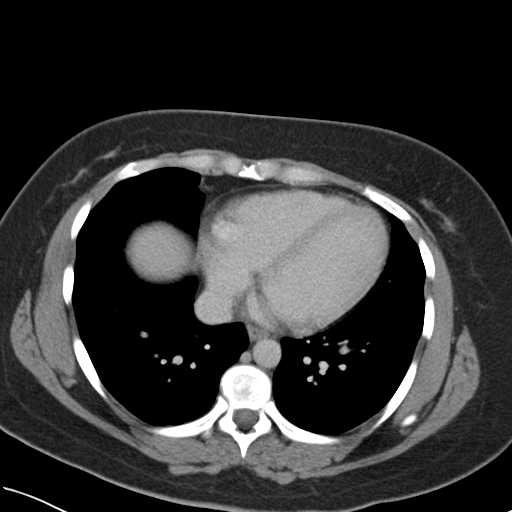
[im 89/94  lung]
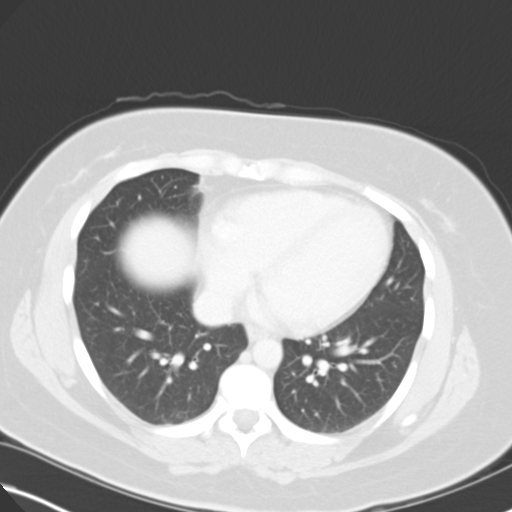

[15 of 32 positions shown; findings below may reference images not displayed]

FINDINGS: LUNG BASES: Included view of the lung bases are clear. Visualized
heart and pericardium are unremarkable.

SOLID ORGANS: The liver, spleen, gallbladder, pancreas and adrenal
glands are unremarkable.

GASTROINTESTINAL TRACT: The stomach, small and large bowel are
normal in course and caliber without inflammatory changes. Normal
appendix.

KIDNEYS/ URINARY TRACT: Kidneys are orthotopic, slightly striated
enhancement of the RIGHT kidney with minimally delayed nephrogram.
No nephrolithiasis, hydronephrosis or solid renal masses. Too small
to characterize hypodensity upper pole of LEFT kidney. The
unopacified ureters are normal in course and caliber. Urinary
bladder is partially distended and unremarkable.

PERITONEUM/RETROPERITONEUM: Aortoiliac vessels are normal in course
and caliber. No lymphadenopathy by CT size criteria. Internal
reproductive organs are unremarkable. No intraperitoneal free fluid
nor free air.

SOFT TISSUE/OSSEOUS STRUCTURES: Non-suspicious. Sub cm probable bone
island LEFT iliac wing. Scattered Schmorl's nodes. Small fat
containing umbilical hernia.
IMPRESSION: Mildly striated RIGHT nephrogram concerning for early pyelonephritis
without obstructive uropathy or nephrolithiasis.

Normal appendix.

  By: [facility]
